# Patient Record
Sex: Male | Born: 1975 | Race: White | Hispanic: No | Marital: Married | Smoking: Never smoker
Health system: Southern US, Community
[De-identification: ages and names within clinical notes are randomized; demographics above are authoritative.]

---

## 2001-04-01 ENCOUNTER — Emergency Department (HOSPITAL_COMMUNITY): Admission: EM | Admit: 2001-04-01 | Discharge: 2001-04-01 | Payer: Self-pay | Admitting: Emergency Medicine

## 2003-12-06 ENCOUNTER — Encounter: Admission: RE | Admit: 2003-12-06 | Discharge: 2004-03-05 | Payer: Self-pay | Admitting: Family Medicine

## 2004-05-01 ENCOUNTER — Ambulatory Visit (HOSPITAL_COMMUNITY)
Admission: RE | Admit: 2004-05-01 | Discharge: 2004-05-01 | Payer: Self-pay | Admitting: Physical Medicine and Rehabilitation

## 2004-06-11 ENCOUNTER — Ambulatory Visit (HOSPITAL_COMMUNITY): Admission: RE | Admit: 2004-06-11 | Discharge: 2004-06-11 | Payer: Self-pay | Admitting: Orthopedic Surgery

## 2010-02-05 ENCOUNTER — Emergency Department (HOSPITAL_COMMUNITY): Admission: EM | Admit: 2010-02-05 | Discharge: 2010-02-05 | Payer: Self-pay | Admitting: Emergency Medicine

## 2013-10-23 IMAGING — CR DG CHEST 2V
2 series · 2 of 2 positions shown · non-contrast
Comparison: None.

CLINICAL DATA: History of pneumonia.

CHEST - 2 VIEW

[view not recorded (1 of 2)]
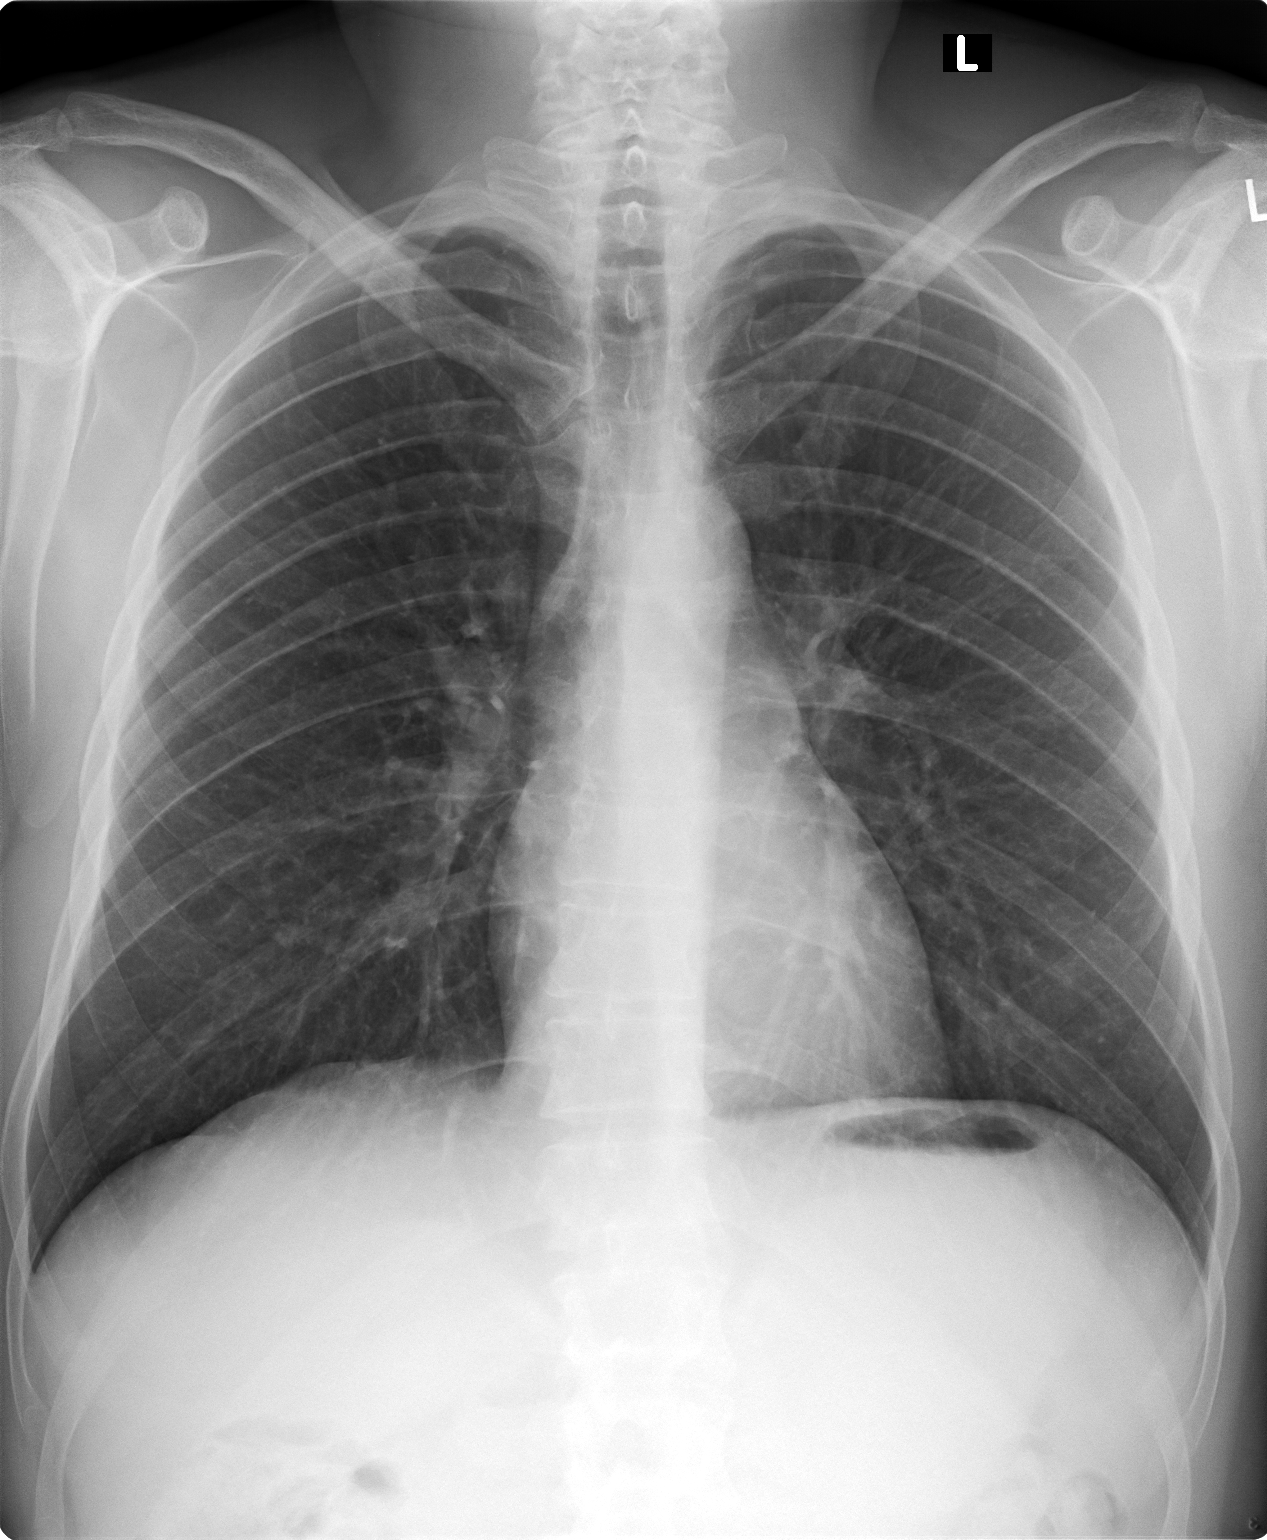

[view not recorded (2 of 2)]
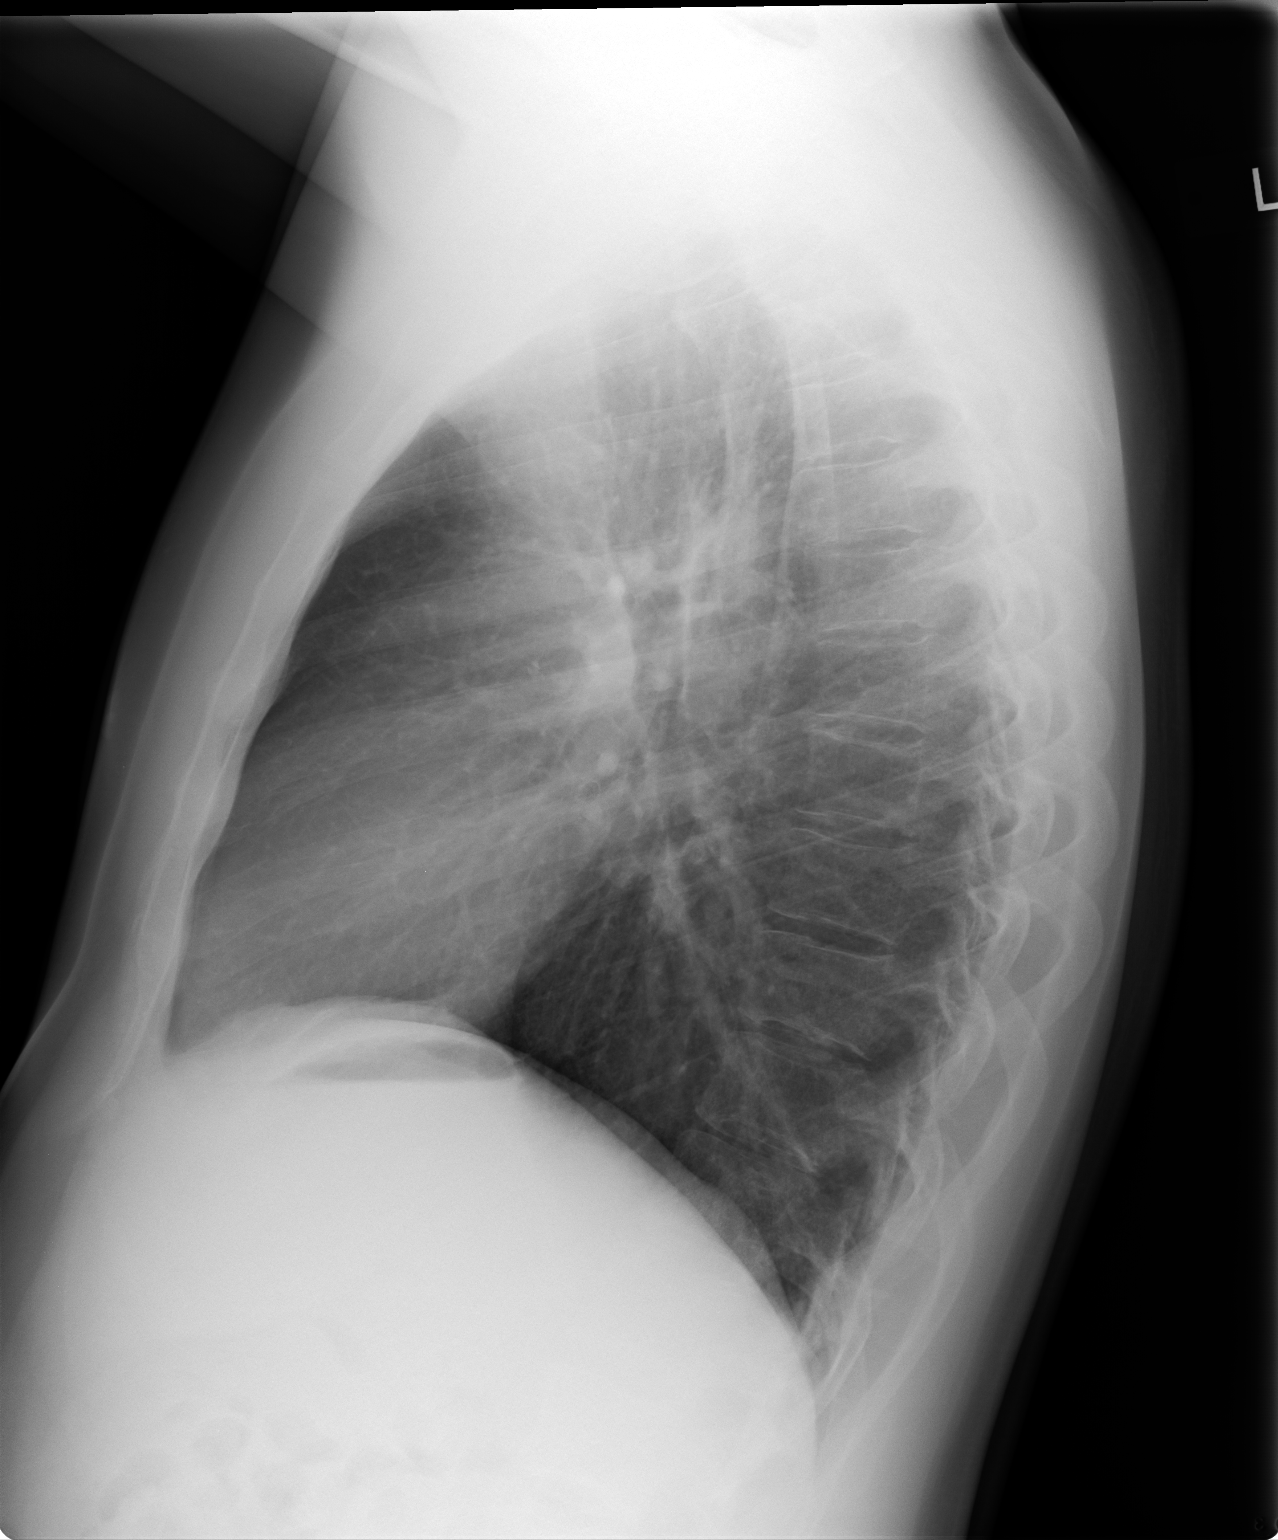

[2 of 2 positions shown; findings below may reference images not displayed]

FINDINGS: Lungs are clear.  Heart size is normal.  No pneumothorax
or pleural fluid.  No focal bony abnormality.
IMPRESSION: Negative chest.

## 2018-01-18 ENCOUNTER — Ambulatory Visit: Payer: Worker's Compensation | Attending: Specialist | Admitting: Physical Therapy

## 2018-01-18 ENCOUNTER — Encounter: Payer: Self-pay | Admitting: Physical Therapy

## 2018-01-18 ENCOUNTER — Other Ambulatory Visit: Payer: Self-pay

## 2018-01-18 DIAGNOSIS — M542 Cervicalgia: Secondary | ICD-10-CM | POA: Insufficient documentation

## 2018-01-18 DIAGNOSIS — M5412 Radiculopathy, cervical region: Secondary | ICD-10-CM | POA: Diagnosis not present

## 2018-01-18 NOTE — Therapy (Signed)
[facility] [address] [place], [place], [address] Phone: [phone]   Fax:  [phone]  Physical Therapy Evaluation  Patient Details  Name: Bryan Lynn MRN: [id number] Date of Birth: 01/11/1976 Referring Provider: Leandra KernJeffery Beane, MD   Encounter Date: 01/18/2018  PT End of Session - 01/18/18 1751    Visit Number  1    Number of Visits  12    Date for PT Re-Evaluation  03/08/18    Authorization Type  Worker's Compensation    Authorization - Visit Number  1    Authorization - Number of Visits  12    PT Start Time  1435    PT Stop Time  1515    PT Time Calculation (min)  40 min    Activity Tolerance  Patient limited by pain    Behavior During Therapy  [facility] for tasks assessed/performed       History reviewed. No pertinent past medical history.  History reviewed. No pertinent surgical history.  There were no vitals filed for this visit.   Subjective Assessment - 01/18/18 1747    Subjective  Patient arrives to physical therapy with reports of neck pain as well as numbness and radiating pain down L UE after a work injury on 11/06/17. Patient reported lifting a 54 lb roller from a tight area when he felt a "kink in the neck"; when he came to stand upright, he heard a pop which caused pain down the anterior aspect of the shoulder down to the left palm.  Patient reported he had steroid injections in the left shoulder as well as in the cervical spine with temporary relief. Patient reports pain can reach an 8/10 with side bending his cervical spine to the left, cervical flexion and certain shoulder movements. Patient's pain at best is 1/10 in the morning and his pain increases as the day progresses. Patient is currently on light duty at work and is restricted by the doctor from lifting greater than 20 lbs overhead. Patient has an appointment for an MRI on 01/24/18. Patient's goals are to decrease pain, improve strength, improve movement, have  less difficulties with work activities and return to [employer].     Limitations  Lifting    Diagnostic tests  X-Ray; MRI pending (01/24/18)    Patient Stated Goals  get back to normal without pain    Currently in Pain?  Yes    Pain Score  3     Pain Location  Neck    Pain Orientation  Left;Posterior    Pain Type  Acute pain    Pain Radiating Towards  L palm    Pain Onset  1 to 4 weeks ago    Pain Frequency  Intermittent    Aggravating Factors   side bending to left or touching tender areas of neck    Pain Relieving Factors  side bending to right    Effect of Pain on Daily Activities  sharp pains with certain L shoulder movements         [facility] PT Assessment - 01/18/18 0001      Assessment   Medical Diagnosis  Cervical radiculopathy    Referring Provider  Leandra KernJeffery Beane, MD    Onset Date/Surgical Date  11/06/17    Hand Dominance  Right    Next MD Visit  n/a    Prior Therapy  no      Precautions   Precaution Comments  no lifting greater than 20 lbs overhead  Balance Screen   Has the patient fallen in the past 6 months  No    Has the patient had a decrease in activity level because of a fear of falling?   No    Is the patient reluctant to leave their home because of a fear of falling?   No      Home Public house manager residence      Prior Function   Level of Independence  Independent      Sensation   Light Touch  Appears Intact      Posture/Postural Control   Posture/Postural Control  Postural limitations    Postural Limitations  Rounded Shoulders;Forward head;Increased thoracic kyphosis      ROM / Strength   AROM / PROM / Strength  AROM;Strength      AROM   AROM Assessment Site  Cervical    Cervical Flexion  19 (+) pain    Cervical Extension  26    Cervical - Right Side Bend  49    Cervical - Left Side Bend  50 (+) pain    Cervical - Right Rotation  65    Cervical - Left Rotation  55 (+) pain      Strength   Strength Assessment Site   Shoulder;Hand    Right/Left Shoulder  Right;Left    Right Shoulder Flexion  4/5    Right Shoulder ABduction  4/5    Right Shoulder Internal Rotation  5/5    Right Shoulder External Rotation  5/5    Left Shoulder Flexion  4-/5    Left Shoulder ABduction  4-/5    Left Shoulder Internal Rotation  5/5    Left Shoulder External Rotation  5/5    Right/Left hand  Right;Left    Right Hand Grip (lbs)  52    Left Hand Grip (lbs)  34      Palpation   Palpation comment  very tender to palpation on on L cervical paraspinals, increased pressure C6-T1 L lamina gutter increases symptoms down L LE, tenderness to palpation to left anterior shoulder      Special Tests    Special Tests  Cervical    Cervical Tests  Dictraction      Distraction Test   Comment  Increase of symptoms with supine distraction                Objective measurements completed on examination: See above findings.              PT Education - 01/18/18 1751    Education Details  scapular retractions, chin tucks    Methods  Handout;Demonstration;Explanation    Comprehension  Returned demonstration;Verbalized understanding       PT Short Term Goals - 01/18/18 1754      PT SHORT TERM GOAL #1   Title  Patient will be independent with HEP    Time  3    Period  Weeks    Status  New      PT SHORT TERM GOAL #2   Title  Patient will report neurological symptom centralization to left shoulder to indicate reduced nerve irritation.    Time  3    Period  Weeks    Status  New        PT Long Term Goals - 01/18/18 1755      PT LONG TERM GOAL #1   Title  Patient will report no neurological symptoms down L UE to  indicate no nerve irritation.    Time  6    Period  Weeks    Status  New      PT LONG TERM GOAL #2   Title  Patient will improve L grip strength to 45 lbs or equal to R grip strength to improve ability to perform work activities.    Time  6    Period  Weeks    Status  New      PT LONG TERM  GOAL #3   Title  Patient will improve L shoulder MMT in all planes to 5/5 to improve stability during work activities.    Time  6    Period  Weeks    Status  New      PT LONG TERM GOAL #4   Title  Patient will improve cervical AROM in all planes to WNL with less than 2/10 pain to improve ability to perform work activities and scan environment.     Time  6    Period  Weeks    Status  New             Plan - 01/18/18 1752    Clinical Impression Statement  Patient is a 42 year old male who presents to physical therapy with cervical and anterior shoulder pain, decreased cervical AROM and decreased left shoulder strength and left grip strength. Patient reports normal sensation; left bicep DTR is 1+/4. Patient tender to palpation along L cervical paraspinals and reports deep pressure around the left lamina gutter of C6-T1 causes increase symptoms down L LE to occur. Patient noted with increased pain while lying supine and reported discomfort with cervical PROM in all planes. Patient would benefit from skilled physical therapy to decrease pain and address patient's goals.     Clinical Presentation  Evolving    Clinical Presentation due to:  not improving    Clinical Decision Making  Low    Rehab Potential  Good    PT Frequency  2x / week    PT Duration  6 weeks    PT Treatment/Interventions  Iontophoresis 4mg /ml Dexamethasone;ADLs/Self Care Home Management;Traction;Moist Heat;Cryotherapy;Electrical Stimulation;Ultrasound;Therapeutic activities;Therapeutic exercise;Patient/family education;Neuromuscular re-education;Manual techniques;Passive range of motion    PT Next Visit Plan  UBE, chin tucks rows, extension, STW/M to L cervical spine, modalities PRN for pain relief.    PT Home Exercise Plan  see patient education section    Consulted and Agree with Plan of Care  Patient       Patient will benefit from skilled therapeutic intervention in order to improve the following deficits and  impairments:  Pain, Impaired UE functional use, Decreased range of motion, Decreased strength  Visit Diagnosis: Radiculopathy, cervical region - Plan: PT plan of care cert/re-cert  Cervicalgia - Plan: PT plan of care cert/re-cert     Problem List There are no active problems to display for this patient.  [name], PT, DPT 01/18/2018, 6:07 PM  [facility] [address] [place], [place], [address] Phone: [phone]   Fax:  [phone]  Name: Bryan Lynn MRN: [id number] Date of Birth: 1976-04-07

## 2018-01-21 ENCOUNTER — Ambulatory Visit: Payer: Worker's Compensation | Admitting: Physical Therapy

## 2018-01-21 ENCOUNTER — Encounter: Payer: Self-pay | Admitting: Physical Therapy

## 2018-01-21 DIAGNOSIS — M5412 Radiculopathy, cervical region: Secondary | ICD-10-CM

## 2018-01-21 DIAGNOSIS — M542 Cervicalgia: Secondary | ICD-10-CM

## 2018-01-21 NOTE — Therapy (Signed)
[facility] [address] [place], [place], [address] Phone: [phone]   Fax:  [phone]  Physical Therapy Treatment  Patient Details  Name: Bryan Lynn MRN: [id number] Date of Birth: 02/25/1976 Referring Provider: Leandra KernJeffery Beane, MD   Encounter Date: 01/21/2018  PT End of Session - 01/21/18 0818    Visit Number  2    Number of Visits  12    Date for PT Re-Evaluation  03/08/18    Authorization Type  Worker's Compensation    Authorization - Visit Number  2    Authorization - Number of Visits  12    PT Start Time  [phone]    PT Stop Time  0908    PT Time Calculation (min)  51 min    Activity Tolerance  Patient limited by pain    Behavior During Therapy  [facility] for tasks assessed/performed       History reviewed. No pertinent past medical history.  History reviewed. No pertinent surgical history.  There were no vitals filed for this visit.  Subjective Assessment - 01/21/18 0817    Subjective  Patient reports he has been doing his HEP and reports pain is 2/10 with some symptoms down to L palm    Limitations  Lifting    Diagnostic tests  X-Ray; MRI pending (01/24/18)    Patient Stated Goals  get back to normal without pain    Currently in Pain?  Yes    Pain Score  2     Pain Location  Neck    Pain Orientation  Left;Posterior    Pain Descriptors / Indicators  Discomfort    Pain Type  Acute pain    Pain Onset  1 to 4 weeks ago    Pain Frequency  Intermittent         [facility] PT Assessment - 01/21/18 0001      Assessment   Medical Diagnosis  Cervical radiculopathy    Onset Date/Surgical Date  11/06/17    Hand Dominance  Right    Next MD Visit  n/a    Prior Therapy  no      Precautions   Precaution Comments  no lifting greater than 20 lbs overhead                   [facility] Adult PT Treatment/Exercise - 01/21/18 0001      Exercises   Exercises  Neck      Neck Exercises: Machines for Strengthening   UBE  (Upper Arm Bike)  120 RPM, 8 minutes (4 fwd, 4 bwd)      Neck Exercises: Theraband   Shoulder Extension  20 reps Pink XTS    Rows  20 reps    Rows Limitations  Pink XTS      Neck Exercises: Standing   Neck Retraction  10 reps;20 reps;3 secs      Modalities   Modalities  Electrical Stimulation;Moist Heat      Moist Heat Therapy   Number Minutes Moist Heat  15 Minutes    Moist Heat Location  Cervical      Electrical Stimulation   Electrical Stimulation Location  cervical spine and L UT    Electrical Stimulation Action  IFC    Electrical Stimulation Parameters  80-150 hz x15 min    Electrical Stimulation Goals  Pain      Manual Therapy   Manual Therapy  Soft tissue mobilization    Soft tissue mobilization  STW/M to cervical  paraspinals and L UT to decrease pain and muscle tone               PT Short Term Goals - 01/18/18 1754      PT SHORT TERM GOAL #1   Title  Patient will be independent with HEP    Time  3    Period  Weeks    Status  New      PT SHORT TERM GOAL #2   Title  Patient will report neurological symptom centralization to left shoulder to indicate reduced nerve irritation.    Time  3    Period  Weeks    Status  New        PT Long Term Goals - 01/18/18 1755      PT LONG TERM GOAL #1   Title  Patient will report no neurological symptoms down L UE to indicate no nerve irritation.    Time  6    Period  Weeks    Status  New      PT LONG TERM GOAL #2   Title  Patient will improve L grip strength to 45 lbs or equal to R grip strength to improve ability to perform work activities.    Time  6    Period  Weeks    Status  New      PT LONG TERM GOAL #3   Title  Patient will improve L shoulder MMT in all planes to 5/5 to improve stability during work activities.    Time  6    Period  Weeks    Status  New      PT LONG TERM GOAL #4   Title  Patient will improve cervical AROM in all planes to WNL with less than 2/10 pain to improve ability to perform  work activities and scan environment.     Time  6    Period  Weeks    Status  New            Plan - 01/21/18 0934    Clinical Impression Statement  Patient was able to tolerate treatment well with minimal reports of pain. Patient noted with good form after initial instruction and demonstration. Distraction in stitting assessed to which reported no pain or increase of symptoms. Normal response to modalities upon end of session.    Clinical Presentation  Evolving    Clinical Decision Making  Low    Rehab Potential  Good    PT Frequency  2x / week    PT Duration  6 weeks    PT Treatment/Interventions  Iontophoresis 4mg /ml Dexamethasone;ADLs/Self Care Home Management;Traction;Moist Heat;Cryotherapy;Electrical Stimulation;Ultrasound;Therapeutic activities;Therapeutic exercise;Patient/family education;Neuromuscular re-education;Manual techniques;Passive range of motion    PT Next Visit Plan  cervical traction, UBE, chin tucks rows, extension, STW/M to L cervical spine, modalities PRN for pain relief.    Consulted and Agree with Plan of Care  Patient       Patient will benefit from skilled therapeutic intervention in order to improve the following deficits and impairments:  Pain, Impaired UE functional use, Decreased range of motion, Decreased strength  Visit Diagnosis: Radiculopathy, cervical region  Cervicalgia     Problem List There are no active problems to display for this patient.  [name], PT, DPT 01/21/2018, 11:07 AM  [facility] [address] [place], [place], [address] Phone: [phone]   Fax:  [phone]  Name: Bryan Lynn MRN: [id number] Date of Birth: [birth year]

## 2018-01-26 ENCOUNTER — Ambulatory Visit: Payer: Worker's Compensation | Admitting: Physical Therapy

## 2018-01-26 DIAGNOSIS — M5412 Radiculopathy, cervical region: Secondary | ICD-10-CM

## 2018-01-26 DIAGNOSIS — M542 Cervicalgia: Secondary | ICD-10-CM

## 2018-01-26 NOTE — Therapy (Signed)
[facility] [address] [place], [place], [address] Phone: [phone]   Fax:  [phone]  Physical Therapy Treatment  Patient Details  Name: Bryan Lynn MRN: [id number] Date of Birth: 14-Apr-1976 Referring Provider: Leandra Kern, MD   Encounter Date: 01/26/2018  PT End of Session - 01/26/18 0819    Visit Number  3    Number of Visits  12    Date for PT Re-Evaluation  03/08/18    Authorization Type  Worker's Compensation    Authorization - Visit Number  3    Authorization - Number of Visits  12    PT Start Time  0818    PT Stop Time  0904    PT Time Calculation (min)  46 min    Activity Tolerance  Patient tolerated treatment well    Behavior During Therapy  [facility] for tasks assessed/performed       No past medical history on file.  No past surgical history on file.  There were no vitals filed for this visit.  Subjective Assessment - 01/26/18 0857    Subjective  Patient reports 3/10 pain with symtoms in anterior shoulder as well as running down to L palm. Patient is to have MRI tomorrow; unsure of MD follow up.    Limitations  Lifting    Diagnostic tests  X-Ray; MRI pending (01/27/18)    Patient Stated Goals  get back to normal without pain    Currently in Pain?  Yes    Pain Score  3     Pain Location  Neck    Pain Orientation  Left;Posterior    Pain Descriptors / Indicators  Numbness;Tingling    Pain Type  Acute pain    Pain Onset  More than a month ago    Pain Frequency  Intermittent                       [facility] Adult PT Treatment/Exercise - 01/26/18 0001      Exercises   Exercises  Neck      Neck Exercises: Machines for Strengthening   UBE (Upper Arm Bike)  120 RPM, 8 minutes (4 fwd, 4 bwd)      Neck Exercises: Theraband   Shoulder Extension  20 reps;10 reps    Shoulder Extension Limitations  Pink XTS    Rows  20 reps;10 reps    Rows Limitations  Pink XTS      Neck Exercises: Standing   Other  Standing Exercises  standing X to V with chin tuck x30      Neck Exercises: Supine   Neck Retraction  20 reps      Modalities   Modalities  Electrical Stimulation;Moist Heat      Moist Heat Therapy   Number Minutes Moist Heat  15 Minutes    Moist Heat Location  Cervical      Electrical Stimulation   Electrical Stimulation Location  80-150 hz x 15 min    Electrical Stimulation Action  IFC    Electrical Stimulation Parameters  80-150 hz x15 min    Electrical Stimulation Goals  Pain      Manual Therapy   Manual Therapy  Soft tissue mobilization;Manual Traction    Soft tissue mobilization  STW/M to cervical paraspinals and L UT to decrease pain and muscle tone    Manual Traction  manual traction 10 x 15 seconds  PT Short Term Goals - 01/18/18 1754      PT SHORT TERM GOAL #1   Title  Patient will be independent with HEP    Time  3    Period  Weeks    Status  New      PT SHORT TERM GOAL #2   Title  Patient will report neurological symptom centralization to left shoulder to indicate reduced nerve irritation.    Time  3    Period  Weeks    Status  New        PT Long Term Goals - 01/18/18 1755      PT LONG TERM GOAL #1   Title  Patient will report no neurological symptoms down L UE to indicate no nerve irritation.    Time  6    Period  Weeks    Status  New      PT LONG TERM GOAL #2   Title  Patient will improve L grip strength to 45 lbs or equal to R grip strength to improve ability to perform work activities.    Time  6    Period  Weeks    Status  New      PT LONG TERM GOAL #3   Title  Patient will improve L shoulder MMT in all planes to 5/5 to improve stability during work activities.    Time  6    Period  Weeks    Status  New      PT LONG TERM GOAL #4   Title  Patient will improve cervical AROM in all planes to WNL with less than 2/10 pain to improve ability to perform work activities and scan environment.     Time  6    Period  Weeks     Status  New            Plan - 01/26/18 0859    Clinical Impression Statement  Patient was able to tolerate treatment well. Patient noted with reports of numbness and tingling down L UE with X to V exercise. Patient was able to continue with rest. Patient was able to tolerate manual treatmetnt well with improvements of symptoms. Attempt mechanical traction at 15 lbs next visit. Normal response to modalities at end of session.     Clinical Presentation  Evolving    Clinical Decision Making  Low    Rehab Potential  Good    PT Frequency  2x / week    PT Duration  6 weeks    PT Treatment/Interventions  Iontophoresis 4mg /ml Dexamethasone;ADLs/Self Care Home Management;Traction;Moist Heat;Cryotherapy;Electrical Stimulation;Ultrasound;Therapeutic activities;Therapeutic exercise;Patient/family education;Neuromuscular re-education;Manual techniques;Passive range of motion    PT Next Visit Plan  Attempt mechanical cervical traction, UBE, chin tucks rows, extension, STW/M to L cervical spine, modalities PRN for pain relief.    Consulted and Agree with Plan of Care  Patient       Patient will benefit from skilled therapeutic intervention in order to improve the following deficits and impairments:  Pain, Impaired UE functional use, Decreased range of motion, Decreased strength  Visit Diagnosis: Radiculopathy, cervical region  Cervicalgia     Problem List There are no active problems to display for this patient.  Guss BundeKrystle Demarques Pilz, PT, DPT 01/26/2018, 9:13 AM  [facility] [address] [place], [place], [address] Phone: [phone]   Fax:  [phone]  Name: Bryan Lynn MRN: [id number] Date of Birth: 1976-05-13

## 2018-01-31 ENCOUNTER — Ambulatory Visit: Payer: Worker's Compensation | Attending: Specialist | Admitting: Physical Therapy

## 2018-01-31 DIAGNOSIS — M5412 Radiculopathy, cervical region: Secondary | ICD-10-CM | POA: Insufficient documentation

## 2018-01-31 DIAGNOSIS — M542 Cervicalgia: Secondary | ICD-10-CM | POA: Diagnosis present

## 2018-01-31 NOTE — Therapy (Addendum)
[facility] [address] [place], [place], [address] Phone: [phone]   Fax:  [phone]  Physical Therapy Treatment  Patient Details  Name: Bryan Lynn MRN: [id number] Date of Birth: [birth year] Referring Provider: Leandra KernJeffery Beane, MD   Encounter Date: 01/31/2018  PT End of Session - 01/31/18 0856    Visit Number  4    Number of Visits  12    Date for PT Re-Evaluation  03/08/18    Authorization Type  Worker's Compensation    Authorization - Visit Number  4    Authorization - Number of Visits  12    PT Start Time  0817    PT Stop Time  0906    PT Time Calculation (min)  49 min    Activity Tolerance  Patient tolerated treatment well    Behavior During Therapy  [facility] for tasks assessed/performed       No past medical history on file.  No past surgical history on file.  There were no vitals filed for this visit.  Subjective Assessment - 01/31/18 0820    Subjective  Patient reports being at about 4.5/10. Reported feeling good and increased activity over the weekend.    Limitations  Lifting    Diagnostic tests  X-Ray; MRI pending (01/27/18)    Patient Stated Goals  get back to normal without pain    Currently in Pain?  Yes    Pain Score  4     Pain Location  Neck    Pain Orientation  Left;Posterior    Pain Descriptors / Indicators  Tingling;Numbness    Pain Type  Acute pain    Pain Onset  More than a month ago    Pain Frequency  Intermittent         [facility] PT Assessment - 01/31/18 0001      Assessment   Medical Diagnosis  Cervical radiculopathy    Onset Date/Surgical Date  11/06/17    Hand Dominance  Right    Next MD Visit  n/a    Prior Therapy  no      Precautions   Precaution Comments  no lifting greater than 20 lbs overhead                   [facility] Adult PT Treatment/Exercise - 01/31/18 0001      Exercises   Exercises  Neck      Neck Exercises: Machines for Strengthening   UBE (Upper Arm Bike)   120 RPM, 8 minutes (4 fwd, 4 bwd)      Neck Exercises: Theraband   Shoulder Extension  20 reps;10 reps    Shoulder Extension Limitations  Pink XTS    Rows  20 reps;10 reps    Rows Limitations  Pink XTS      Modalities   Modalities  Electrical Stimulation;Moist Heat;Ultrasound      Moist Heat Therapy   Number Minutes Moist Heat  15 Minutes    Moist Heat Location  Cervical      Electrical Stimulation   Electrical Stimulation Location  80-150 hz x 15 min    Electrical Stimulation Action  IFC    Electrical Stimulation Parameters  80-150 hz x15 min    Electrical Stimulation Goals  Pain      Ultrasound   Ultrasound Location  L cervical paraspinals and L UT    Ultrasound Parameters  Combo E-stim/US 100%, 1mhz , 1.5 w/cm2 x12 mins    Ultrasound Goals  Pain  PT Short Term Goals - 01/18/18 1754      PT SHORT TERM GOAL #1   Title  Patient will be independent with HEP    Time  3    Period  Weeks    Status  New      PT SHORT TERM GOAL #2   Title  Patient will report neurological symptom centralization to left shoulder to indicate reduced nerve irritation.    Time  3    Period  Weeks    Status  New        PT Long Term Goals - 01/18/18 1755      PT LONG TERM GOAL #1   Title  Patient will report no neurological symptoms down L UE to indicate no nerve irritation.    Time  6    Period  Weeks    Status  New      PT LONG TERM GOAL #2   Title  Patient will improve L grip strength to 45 lbs or equal to R grip strength to improve ability to perform work activities.    Time  6    Period  Weeks    Status  New      PT LONG TERM GOAL #3   Title  Patient will improve L shoulder MMT in all planes to 5/5 to improve stability during work activities.    Time  6    Period  Weeks    Status  New      PT LONG TERM GOAL #4   Title  Patient will improve cervical AROM in all planes to WNL with less than 2/10 pain to improve ability to perform work activities and scan  environment.     Time  6    Period  Weeks    Status  New            Plan - 01/31/18 0857    Clinical Impression Statement  Patient was able to tolerate well despite increased pain at start of session. Patient noted with good response to US/E-stim combo. Patient educated traction will be placed on hold until improvements in pain. Patient reported agreeement.    Clinical Presentation  Evolving    Clinical Decision Making  Low    Rehab Potential  Good    PT Frequency  2x / week    PT Duration  6 weeks    PT Treatment/Interventions  Iontophoresis 4mg /ml Dexamethasone;ADLs/Self Care Home Management;Traction;Moist Heat;Cryotherapy;Electrical Stimulation;Ultrasound;Therapeutic activities;Therapeutic exercise;Patient/family education;Neuromuscular re-education;Manual techniques;Passive range of motion    PT Next Visit Plan  Attempt mechanical cervical traction, UBE, chin tucks rows, extension, STW/M to L cervical spine, modalities PRN for pain relief.    Consulted and Agree with Plan of Care  Patient       Patient will benefit from skilled therapeutic intervention in order to improve the following deficits and impairments:  Pain, Impaired UE functional use, Decreased range of motion, Decreased strength  Visit Diagnosis: Radiculopathy, cervical region  Cervicalgia     Problem List There are no active problems to display for this patient.  [name], PT, DPT 01/31/2018, 9:16 AM  [facility] [address] [place], [place], [address] Phone: [phone]   Fax:  [phone]  Name: Bryan Lynn MRN: [id number] Date of Birth: 02-02-1976

## 2018-02-04 ENCOUNTER — Ambulatory Visit: Payer: Worker's Compensation | Admitting: Physical Therapy

## 2018-02-04 DIAGNOSIS — M5412 Radiculopathy, cervical region: Secondary | ICD-10-CM | POA: Diagnosis not present

## 2018-02-04 DIAGNOSIS — M542 Cervicalgia: Secondary | ICD-10-CM

## 2018-02-04 NOTE — Therapy (Signed)
[facility] [address] [place], [place], [address] Phone: [phone]   Fax:  [phone]  Physical Therapy Treatment  Patient Details  Name: Bryan Lynn SessionMichael Lynn MRN: [id number] Date of Birth: Oct 13, 1975 Referring Provider: Leandra KernJeffery Beane, MD   Encounter Date: 02/04/2018  PT End of Session - 02/04/18 0856    Visit Number  5    Number of Visits  12    Date for PT Re-Evaluation  03/08/18    Authorization Type  Worker's Compensation    Authorization - Visit Number  5    Authorization - Number of Visits  12    PT Start Time  [phone]    PT Stop Time  0903    PT Time Calculation (min)  47 min    Activity Tolerance  Patient tolerated treatment well    Behavior During Therapy  [facility] for tasks assessed/performed       No past medical history on file.  No past surgical history on file.  There were no vitals filed for this visit.  Subjective Assessment - 02/04/18 0819    Subjective  Reports 2.5/10 and still has neurological symptoms down L UE.    Limitations  Lifting    Diagnostic tests  X-Ray; MRI pending (01/27/18)    Patient Stated Goals  get back to normal without pain    Currently in Pain?  Yes    Pain Score  2     Pain Location  Neck    Pain Orientation  Left;Posterior    Pain Descriptors / Indicators  Tightness;Numbness    Pain Type  Acute pain    Pain Onset  More than a month ago         [facility] - 02/04/18 0001      Assessment   Medical Diagnosis  Cervical radiculopathy    Onset Date/Surgical Date  11/06/17    Hand Dominance  Right    Next MD Visit  August 20    Prior Therapy  no      Precautions   Precaution Comments  no lifting greater than 20 lbs overhead                   [facility] Adult PT Treatment/Exercise - 02/04/18 0001      Exercises   Exercises  Neck      Neck Exercises: Machines for Strengthening   UBE (Upper Arm Bike)  90 RPM; 8 mins (4 fwd, 4 bwd)      Neck Exercises: Theraband   Shoulder Extension  20 reps;10 reps    Shoulder Extension Limitations  Pink XTS    Rows  20 reps;10 reps    Rows Limitations  Pink XTS      Neck Exercises: Seated   W Back Limitations  attempted, x10      Neck Exercises: Supine   Capital Flexion  10 reps;3 secs      Modalities   Modalities  Electrical Stimulation;Moist Heat;Ultrasound      Moist Heat Therapy   Number Minutes Moist Heat  15 Minutes    Moist Heat Location  Cervical      Electrical Stimulation   Electrical Stimulation Location  80-150 hz x15 mins    Electrical Stimulation Action  IFC    Electrical Stimulation Parameters  80-150 hz x15    Electrical Stimulation Goals  Pain      Neck Exercises: Stretches   Lower Cervical/Upper Thoracic Stretch  3 reps;20 seconds  PT Short Term Goals - 02/04/18 0901      PT SHORT TERM GOAL #1   Title  Patient will be independent with HEP    Time  3    Period  Weeks    Status  On-going      PT SHORT TERM GOAL #2   Title  Patient will report neurological symptom centralization to left shoulder to indicate reduced nerve irritation.    Time  3    Period  Weeks    Status  On-going        PT Long Term Goals - 02/04/18 0902      PT LONG TERM GOAL #1   Title  Patient will report no neurological symptoms down L UE to indicate no nerve irritation.    Time  6    Period  Weeks    Status  On-going      PT LONG TERM GOAL #2   Title  Patient will improve L grip strength to 45 lbs or equal to R grip strength to improve ability to perform work activities.    Time  6    Period  Weeks    Status  On-going   NT 02/04/18     PT LONG TERM GOAL #3   Title  Patient will improve L shoulder MMT in all planes to 5/5 to improve stability during work activities.    Time  6    Period  Weeks    Status  On-going      PT LONG TERM GOAL #4   Title  Patient will improve cervical AROM in all planes to WNL with less than 2/10 pain to improve ability to perform work  activities and scan environment.     Time  6    Period  Weeks    Status  On-going            Plan - 02/04/18 0919    Clinical Impression Statement  Patient was able to tolerate treatment well despite increase of pain during W exercise. Exercise attempted for 10 reps then terminated. Patient inquired exercises for cervical arthritis and spinal stenosis. Patient provided with cervical neck stretches educated on anatomy and treatment for diagnoses. Patient reported understanding. Normal response to modalities upon removal.     Clinical Presentation  Evolving    Clinical Decision Making  Low    Rehab Potential  Good    PT Frequency  2x / week    PT Duration  6 weeks    PT Treatment/Interventions  Iontophoresis 4mg /ml Dexamethasone;ADLs/Self Care Home Management;Traction;Moist Heat;Cryotherapy;Electrical Stimulation;Ultrasound;Therapeutic activities;Therapeutic exercise;Patient/family education;Neuromuscular re-education;Manual techniques;Passive range of motion    PT Next Visit Plan  Attempt mechanical cervical traction, UBE, chin tucks rows, extension, STW/M to L cervical spine, modalities PRN for pain relief.    Consulted and Agree with Plan of Care  Patient       Patient will benefit from skilled therapeutic intervention in order to improve the following deficits and impairments:  Pain, Impaired UE functional use, Decreased range of motion, Decreased strength  Visit Diagnosis: Radiculopathy, cervical region  Cervicalgia     Problem List There are no active problems to display for this patient.  Bryan Lynn, PT, DPT 02/04/2018, 9:22 AM  [facility] [address] [place], [place], [address] Phone: [phone]   Fax:  [phone]  Name: Bryan Lynn MRN: [id number] Date of Birth: Nov 13, 1975

## 2018-02-09 ENCOUNTER — Ambulatory Visit: Payer: Worker's Compensation | Admitting: Physical Therapy

## 2018-02-09 ENCOUNTER — Encounter: Payer: Self-pay | Admitting: Physical Therapy

## 2018-02-09 DIAGNOSIS — M5412 Radiculopathy, cervical region: Secondary | ICD-10-CM

## 2018-02-09 DIAGNOSIS — M542 Cervicalgia: Secondary | ICD-10-CM

## 2018-02-09 NOTE — Therapy (Signed)
[facility] [address] [place], [place], [address] Phone: [phone]   Fax:  [phone]  Physical Therapy Treatment  Patient Details  Name: Bryan Lynn SessionMichael Spradley MRN: [id number] Date of Birth: [birth year] Referring Provider: Leandra KernJeffery Beane, MD   Encounter Date: 02/09/2018  PT End of Session - 02/09/18 0932    Visit Number  6    Number of Visits  12    Date for PT Re-Evaluation  03/08/18    Authorization Type  Worker's Compensation    PT Start Time  0901    PT Stop Time  0946    PT Time Calculation (min)  45 min    Activity Tolerance  Patient tolerated treatment well    Behavior During Therapy  [facility] for tasks assessed/performed       History reviewed. No pertinent past medical history.  History reviewed. No pertinent surgical history.  There were no vitals filed for this visit.  Subjective Assessment - 02/09/18 0904    Subjective  no new complaints, some overall improvement    Limitations  Lifting    Diagnostic tests  X-Ray; MRI pending (01/27/18)    Patient Stated Goals  get back to normal without pain    Currently in Pain?  Yes    Pain Score  2     Pain Location  Neck    Pain Orientation  Left    Pain Descriptors / Indicators  Discomfort;Aching    Pain Type  Acute pain    Pain Radiating Towards  forearm    Pain Onset  More than a month ago    Pain Frequency  Intermittent    Aggravating Factors   certain movements    Pain Relieving Factors  at rest                       [facility] - 02/09/18 0001      Exercises   Exercises  Neck      Neck Exercises: Machines for Strengthening   UBE (Upper Arm Bike)  90 RPM; 8 mins (4 fwd, 4 bwd)      Neck Exercises: Theraband   Shoulder Extension  20 reps;10 reps    Shoulder Extension Limitations  Pink XTS    Rows  20 reps;10 reps    Rows Limitations  Pink XTS      Neck Exercises: Standing   Neck Retraction  Other reps (comment)    Neck  Retraction Limitations  2x10 with 2# flexion and scaption /chin tucks    Wall Push Ups  20 reps      Neck Exercises: Seated   W Back  20 reps;Limitations    W Back Limitations  with scap retractions using ball for restisance      Neck Exercises: Supine   Cervical Isometrics  Flexion;Extension;Right lateral flexion;Left lateral flexion;5 secs;5 reps      Moist Heat Therapy   Number Minutes Moist Heat  15 Minutes    Moist Heat Location  Cervical      Electrical Stimulation   Electrical Stimulation Location  80-150 hz x15 mins    Electrical Stimulation Action  IFC    Electrical Stimulation Parameters  80-150hz  x2715min    Electrical Stimulation Goals  Pain               PT Short Term Goals - 02/04/18 0901      PT SHORT TERM GOAL #1   Title  Patient will be  independent with HEP    Time  3    Period  Weeks    Status  On-going      PT SHORT TERM GOAL #2   Title  Patient will report neurological symptom centralization to left shoulder to indicate reduced nerve irritation.    Time  3    Period  Weeks    Status  On-going        PT Long Term Goals - 02/09/18 [id number]      PT LONG TERM GOAL #1   Title  Patient will report no neurological symptoms down L UE to indicate no nerve irritation.    Time  6    Period  Weeks    Status  On-going   down to forearm 02/09/18     PT LONG TERM GOAL #2   Title  Patient will improve L grip strength to 45 lbs or equal to R grip strength to improve ability to perform work activities.    Time  6    Period  Weeks    Status  --   35# 02/09/18     PT LONG TERM GOAL #3   Title  Patient will improve L shoulder MMT in all planes to 5/5 to improve stability during work activities.    Time  6    Period  Weeks    Status  On-going      PT LONG TERM GOAL #4   Title  Patient will improve cervical AROM in all planes to WNL with less than 2/10 pain to improve ability to perform work activities and scan environment.     Time  6    Period  Weeks     Status  On-going            Plan - 02/09/18 [id number]    Clinical Impression Statement  Patient tolerated treatment well today. Patient able to progress with cervical stabilization exercises with good form and no discomfort. Patient has some ongoing ache that radiates down left UE to forearm. Patient progressing toward goals yet limited with pain.     Rehab Potential  Good    PT Frequency  2x / week    PT Duration  6 weeks    PT Treatment/Interventions  Iontophoresis 4mg /ml Dexamethasone;ADLs/Self Care Home Management;Traction;Moist Heat;Cryotherapy;Electrical Stimulation;Ultrasound;Therapeutic activities;Therapeutic exercise;Patient/family education;Neuromuscular re-education;Manual techniques;Passive range of motion    PT Next Visit Plan  Attempt mechanical cervical traction and RW4. STW/M to L cervical spine, modalities PRN for pain relief.    Consulted and Agree with Plan of Care  Patient       Patient will benefit from skilled therapeutic intervention in order to improve the following deficits and impairments:  Pain, Impaired UE functional use, Decreased range of motion, Decreased strength  Visit Diagnosis: Radiculopathy, cervical region  Cervicalgia     Problem List There are no active problems to display for this patient.   [name], CHRISTINA Lynn, PTA 02/09/2018, 9:54 AM  [facility] [address] [place], [place], [address] Phone: [phone]   Fax:  [phone]  Name: Bryan Lynn SessionMichael Muston MRN: [id number] Date of Birth: [birth year]

## 2018-02-14 ENCOUNTER — Encounter: Payer: Self-pay | Admitting: Physical Therapy

## 2018-02-14 ENCOUNTER — Ambulatory Visit: Payer: Worker's Compensation | Admitting: Physical Therapy

## 2018-02-14 DIAGNOSIS — M542 Cervicalgia: Secondary | ICD-10-CM

## 2018-02-14 DIAGNOSIS — M5412 Radiculopathy, cervical region: Secondary | ICD-10-CM | POA: Diagnosis not present

## 2018-02-14 NOTE — Therapy (Addendum)
[facility] [place], [place], [address] Phone: [phone]   Fax:  [phone]  Physical Therapy Treatment/Discharge  PHYSICAL THERAPY DISCHARGE SUMMARY  Visits from Start of Care: 7  Current functional level related to goals / functional outcomes: See below   Remaining deficits: See goals   Education / Equipment: HEP Plan: Patient agrees to discharge.  Patient goals were not met. Patient is being discharged due to not returning since the last visit.  ?????   Gabriela Eves, PT, DPT    Patient Details  Name: Bryan Lynn MRN: [id number] Date of Birth: [birth year] Referring Provider: Donnald Garre, MD   Encounter Date: 02/14/2018  PT End of Session - 02/14/18 0748    Visit Number  7    Number of Visits  12    Date for PT Re-Evaluation  03/08/18    Authorization Type  Worker's Compensation    Authorization - Visit Number  7    Authorization - Number of Visits  12    PT Start Time  0730    PT Stop Time  0820    PT Time Calculation (min)  50 min    Activity Tolerance  Patient tolerated treatment well    Behavior During Therapy  [facility] for tasks assessed/performed       History reviewed. No pertinent past medical history.  History reviewed. No pertinent surgical history.  There were no vitals filed for this visit.  Subjective Assessment - 02/14/18 0739    Subjective  Patient reported "doing a little too much over the weekend."    Limitations  Lifting    Diagnostic tests  X-Ray; MRI pending (01/27/18)    Patient Stated Goals  get back to normal without pain    Currently in Pain?  Yes    Pain Score  4     Pain Location  Neck    Pain Orientation  Left    Pain Descriptors / Indicators  Discomfort;Aching;Tingling    Pain Type  Acute pain    Pain Onset  More than a month ago    Pain Frequency  Intermittent         [facility] PT Assessment - 02/14/18 0001      Assessment   Medical Diagnosis  Cervical  radiculopathy    Onset Date/Surgical Date  11/06/17    Hand Dominance  Right    Next MD Visit  August 20    Prior Therapy  no      AROM   Cervical Flexion  34   (+)tightness   Cervical Extension  40    Cervical - Right Side Bend  30    Cervical - Left Side Bend  32    Cervical - Right Rotation  65    Cervical - Left Rotation  55   (+) tight/pain     Strength   Right Shoulder Flexion  4/5    Right Shoulder ABduction  4+/5    Right Shoulder Internal Rotation  5/5    Right Shoulder External Rotation  5/5    Left Shoulder Flexion  4/5    Left Shoulder ABduction  4/5    Left Shoulder Internal Rotation  5/5    Left Shoulder External Rotation  5/5    Right Hand Grip (lbs)  110 lbs/ 48 kg    Left Hand Grip (lbs)  65 lbs/ 30 kg  [facility] Adult PT Treatment/Exercise - 02/14/18 0001      Exercises   Exercises  Neck      Neck Exercises: Machines for Strengthening   UBE (Upper Arm Bike)  90 RPM; 8 mins (4 fwd, 4 bwd)      Neck Exercises: Theraband   Shoulder Extension  20 reps;10 reps    Shoulder Extension Limitations  Pink XTS    Rows  20 reps;10 reps    Rows Limitations  Pink XTS      Neck Exercises: Standing   Upper Extremity D2  Flexion;20 reps      Neck Exercises: Supine   Cervical Isometrics  Flexion;Extension;Right lateral flexion;Left lateral flexion;5 secs;10 reps    Capital Flexion  10 reps;3 secs      Modalities   Modalities  Electrical Stimulation;Moist Heat      Moist Heat Therapy   Number Minutes Moist Heat  15 Minutes    Moist Heat Location  Cervical      Electrical Stimulation   Electrical Stimulation Location  80-150 hz x15 mins    Electrical Stimulation Action  IFC    Electrical Stimulation Parameters  80-150 x15 min    Electrical Stimulation Goals  Pain      Neck Exercises: Stretches   Upper Trapezius Stretch  Right;3 reps;30 seconds    Levator Stretch  Right;3 reps;30 seconds               PT Short Term  Goals - 02/04/18 0901      PT SHORT TERM GOAL #1   Title  Patient will be independent with HEP    Time  3    Period  Weeks    Status  On-going      PT SHORT TERM GOAL #2   Title  Patient will report neurological symptom centralization to left shoulder to indicate reduced nerve irritation.    Time  3    Period  Weeks    Status  On-going        PT Long Term Goals - 02/09/18 2947      PT LONG TERM GOAL #1   Title  Patient will report no neurological symptoms down L UE to indicate no nerve irritation.    Time  6    Period  Weeks    Status  On-going   down to forearm 02/09/18     PT LONG TERM GOAL #2   Title  Patient will improve L grip strength to 45 lbs or equal to R grip strength to improve ability to perform work activities.    Time  6    Period  Weeks    Status  --   35# 02/09/18     PT LONG TERM GOAL #3   Title  Patient will improve L shoulder MMT in all planes to 5/5 to improve stability during work activities.    Time  6    Period  Weeks    Status  On-going      PT LONG TERM GOAL #4   Title  Patient will improve cervical AROM in all planes to WNL with less than 2/10 pain to improve ability to perform work activities and scan environment.     Time  6    Period  Weeks    Status  On-going            Plan - 02/14/18 0809    Clinical Impression Statement  Patient was able to completete despite reports  of increase of pain at start of session. Patient overall believes therapy has been helping but progress is slow and still experiences neurological symptoms down L UE intermittently. Patient has been compliant with HEP. Patient's goals are ongoing at this time. Normal response to modalities at end of session.     Clinical Presentation  Evolving    Clinical Decision Making  Low    Rehab Potential  Good    PT Frequency  2x / week    PT Duration  6 weeks    PT Treatment/Interventions  Iontophoresis '4mg'$ /ml Dexamethasone;ADLs/Self Care Home Management;Traction;Moist  Heat;Cryotherapy;Electrical Stimulation;Ultrasound;Therapeutic activities;Therapeutic exercise;Patient/family education;Neuromuscular re-education;Manual techniques;Passive range of motion    PT Next Visit Plan  Add RW4. STW/M to L cervical spine, modalities PRN for pain relief.    Consulted and Agree with Plan of Care  Patient       Patient will benefit from skilled therapeutic intervention in order to improve the following deficits and impairments:  Pain, Impaired UE functional use, Decreased range of motion, Decreased strength  Visit Diagnosis: Radiculopathy, cervical region  Cervicalgia     Problem List There are no active problems to display for this patient.  Gabriela Eves, PT, DPT 02/14/2018, 8:38 AM  [facility] [address] [place], [place], [address] Phone: [phone]   Fax:  [phone]  Name: Bren Steers MRN: [id number] Date of Birth: May 20, 1976

## 2018-02-18 ENCOUNTER — Encounter: Payer: Self-pay | Admitting: Physical Therapy

## 2018-08-13 ENCOUNTER — Emergency Department (HOSPITAL_COMMUNITY): Payer: Managed Care, Other (non HMO)

## 2018-08-13 ENCOUNTER — Encounter (HOSPITAL_COMMUNITY): Payer: Self-pay

## 2018-08-13 ENCOUNTER — Emergency Department (HOSPITAL_COMMUNITY)
Admission: EM | Admit: 2018-08-13 | Discharge: 2018-08-13 | Disposition: A | Discharge status: Home / Self Care | Payer: Managed Care, Other (non HMO) | Attending: Emergency Medicine | Admitting: Emergency Medicine

## 2018-08-13 ENCOUNTER — Other Ambulatory Visit: Payer: Self-pay

## 2018-08-13 DIAGNOSIS — N50811 Right testicular pain: Secondary | ICD-10-CM | POA: Insufficient documentation

## 2018-08-13 LAB — URINALYSIS, ROUTINE W REFLEX MICROSCOPIC
Bilirubin Urine: NEGATIVE
Glucose, UA: NEGATIVE mg/dL
Hgb urine dipstick: NEGATIVE
Ketones, ur: NEGATIVE mg/dL
Leukocytes,Ua: NEGATIVE
Nitrite: NEGATIVE
Protein, ur: NEGATIVE mg/dL
Specific Gravity, Urine: 1.005 (ref 1.005–1.030)
pH: 5 (ref 5.0–8.0)

## 2018-08-13 NOTE — ED Triage Notes (Signed)
Pt c/o pain in r testicle and r groin/pelvic area x 2 weeks.  Denies injury, strenuous activity, denies any bleeding or discharge.

## 2018-08-13 NOTE — ED Provider Notes (Signed)
[facility] EMERGENCY DEPARTMENT Provider Note   CSN: [id number] Arrival date & time: 08/13/18  0754     History   Chief Complaint Chief Complaint  Patient presents with  . Testicle Pain    HPI Bryan Lynn is a 43 y.o. male.  HPI   Bryan Lynn is a 43 y.o. male who presents to the Emergency Department complaining of pain to his right testicle and groin for 2 weeks.  Pain worsening for 2 days.  He describes the pain as a "pulling" sensation from his testicle that radiates up to his suprapubic region.  He states at times he feels his testicle "drawing up."  He denies known injury, swelling, redness, dysuria or hesitancy, rash and abdominal pain.  No previous abdominal surgeries, penile discharge, fever, chills or vomiting.     History reviewed. No pertinent past medical history.  There are no active problems to display for this patient.   History reviewed. No pertinent surgical history.    Home Medications    Prior to Admission medications   Not on File    Family History No family history on file.  Social History Social History   Tobacco Use  . Smoking status: Never Smoker  . Smokeless tobacco: Never Used  Substance Use Topics  . Alcohol use: Yes    Comment: occ  . Drug use: Never     Allergies   Patient has no known allergies.   Review of Systems Review of Systems  Constitutional: Negative for appetite change, chills and fever.  Respiratory: Negative for shortness of breath.   Cardiovascular: Negative for chest pain.  Gastrointestinal: Positive for abdominal pain, nausea and vomiting. Negative for blood in stool and diarrhea.  Genitourinary: Positive for testicular pain. Negative for decreased urine volume, difficulty urinating, discharge, dysuria, flank pain, genital sores, penile pain, penile swelling and scrotal swelling.  Musculoskeletal: Negative for back pain.  Skin: Negative for color change and rash.  Neurological: Negative for  dizziness, weakness and numbness.  Hematological: Negative for adenopathy.     Physical Exam Updated Vital Signs BP 137/89 (BP Location: Right Arm)   Pulse 77   Temp 98.4 F (36.9 C) (Oral)   Resp 18   Ht 5\' 8"  (1.727 m)   Wt 79.4 kg   SpO2 99%   BMI 26.61 kg/m   Physical Exam Vitals signs and nursing note reviewed.  Constitutional:      General: He is not in acute distress.    Appearance: Normal appearance. He is not ill-appearing.     Comments: Pt's spouse is present during exam  HENT:     Mouth/Throat:     Mouth: Mucous membranes are moist.     Pharynx: Oropharynx is clear.  Neck:     Musculoskeletal: Normal range of motion.  Cardiovascular:     Rate and Rhythm: Normal rate and regular rhythm.     Pulses: Normal pulses.  Pulmonary:     Effort: Pulmonary effort is normal.     Breath sounds: Normal breath sounds.  Abdominal:     General: There is no distension.     Palpations: Abdomen is soft. There is no mass.     Tenderness: There is no abdominal tenderness. There is no guarding.     Hernia: There is no hernia in the right inguinal area.  Genitourinary:    Pubic Area: No rash.      Penis: Circumcised. No tenderness, discharge or swelling.      Scrotum/Testes: Cremasteric reflex  is present.        Right: Tenderness present. Mass or swelling not present.        Left: Mass, tenderness or swelling not present.     Epididymis:     Right: No tenderness.     Left: Normal.  Musculoskeletal: Normal range of motion.  Skin:    General: Skin is warm.     Capillary Refill: Capillary refill takes less than 2 seconds.     Findings: No rash.  Neurological:     General: No focal deficit present.     Mental Status: He is alert.     Sensory: No sensory deficit.     Motor: No weakness.      ED Treatments / Results  Labs (all labs ordered are listed, but only abnormal results are displayed) Labs Reviewed  URINALYSIS, ROUTINE W REFLEX MICROSCOPIC - Abnormal; Notable  for the following components:      Result Value   Color, Urine STRAW (*)    All other components within normal limits  GC/CHLAMYDIA PROBE AMP ([place]) NOT AT [facility]    EKG None  Radiology US Scrotum W/doppler  Result Date: 08/13/2018 CLINICAL DATA:  Right testis and groin pain worsening over the last 2 weeks. EXAM: SCROTAL ULTRASOUND DOPPLER ULTRASOUND OF THE TESTICLES TECHNIQUE: Complete ultrasound examination of the testicles, epididymis, and other scrotal structures was performed. Color and spectral Doppler ultrasound were also utilized to evaluate blood flow to the testicles. COMPARISON:  None. FINDINGS: Right testicle Measurements: 3.8 x 1.5 x 2.5 cm. No mass or microlithiasis visualized. Normal blood flow with color Doppler. Left testicle Measurements: 2.6 x 1.6 x 2.4 cm. No mass or microlithiasis visualized.Normal blood flow with color Doppler. Right epididymis:  Normal in size and appearance. Left epididymis:  Normal in size and appearance. Hydrocele:  None visualized. Varicocele:  None visualized. Pulsed Doppler interrogation of both testes demonstrates normal low resistance arterial and venous waveforms bilaterally. IMPRESSION: Normal scrotal ultrasound.  No evidence of testicular torsion. Electronically Signed   By: Carey Bullocks M.D.   On: 08/13/2018 10:54    Procedures Procedures (including critical care time)  Medications Ordered in ED Medications - No data to display   Initial Impression / Assessment and Plan / ED Course  I have reviewed the triage vital signs and the nursing notes.  Pertinent labs & imaging results that were available during my care of the patient were reviewed by me and considered in my medical decision making (see chart for details).     0830  Pt with right sided testicular pain.  No palpable masses.  Will obtain U/A, check for sti's and scrotal US.     [place] and exam reassuring.  Pt appropriate for d/c home, agrees to scrotal support, NSAID if  needed for pain and urology f/u.  Return precautions discussed.     Final Clinical Impressions(s) / ED Diagnoses   Final diagnoses:  Pain in right testicle    ED Discharge Orders    None       Pauline Aus, PA-C 08/13/18 1121    Mancel Bale, MD 08/13/18 1553

## 2018-08-13 NOTE — Discharge Instructions (Addendum)
Try wearing underwear that will provide support.  You may take ibuprofen every 6 hrs if needed for pain.  Follow-up with your doctor for recheck, or contact the urologist, Dr. Annabell Howells, at the number listed.  Return to ER for any worsening symptoms

## 2018-08-15 LAB — GC/CHLAMYDIA PROBE AMP (~~LOC~~) NOT AT ARMC
Chlamydia: NEGATIVE
Neisseria Gonorrhea: NEGATIVE

## 2020-06-14 IMAGING — US US SCROTUM W/ DOPPLER COMPLETE
1 series · 14 of 25 positions shown · non-contrast
Comparison: None.

CLINICAL DATA: Right testis and groin pain worsening over the last
2 weeks.

EXAM:
SCROTAL ULTRASOUND
DOPPLER ULTRASOUND OF THE TESTICLES
TECHNIQUE: Complete ultrasound examination of the testicles, epididymis, and
other scrotal structures was performed. Color and spectral Doppler
ultrasound were also utilized to evaluate blood flow to the
testicles.

[Series 1: us scrotum w/ doppler complete · 14 of 81 slices shown]
[im 1/81]
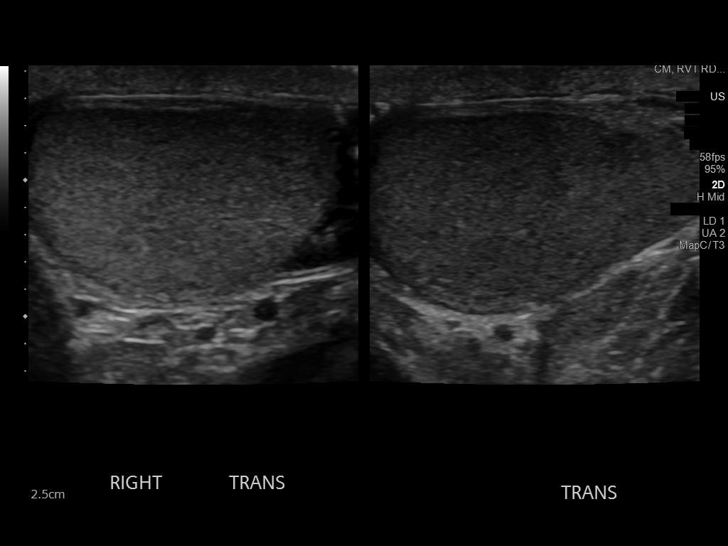
[im 7/81]
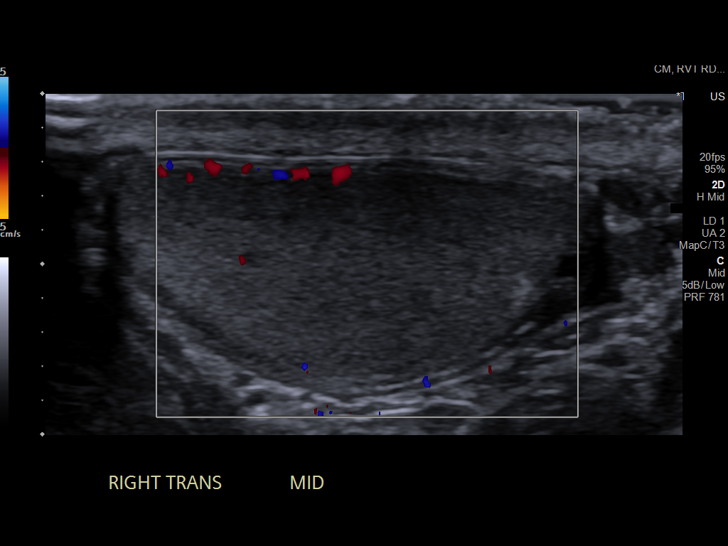
[im 14/81]
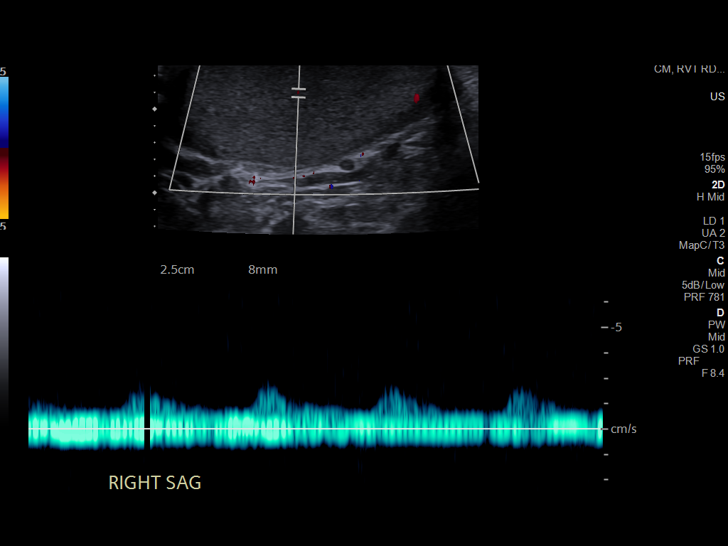
[im 21/81]
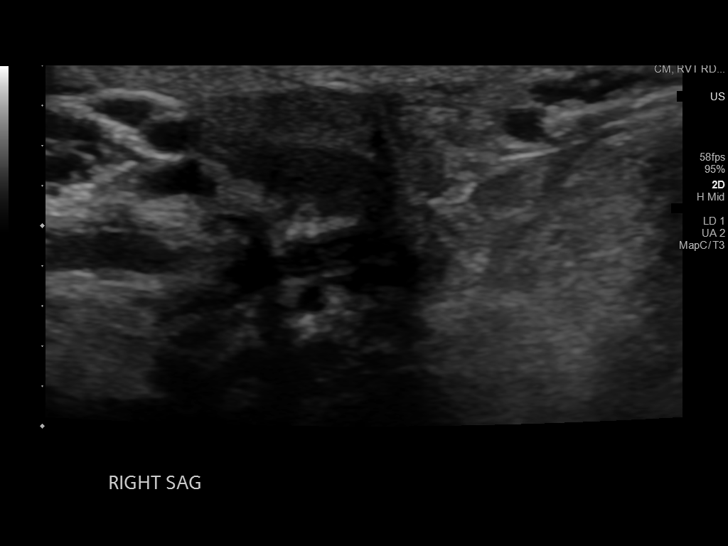
[im 27/81]
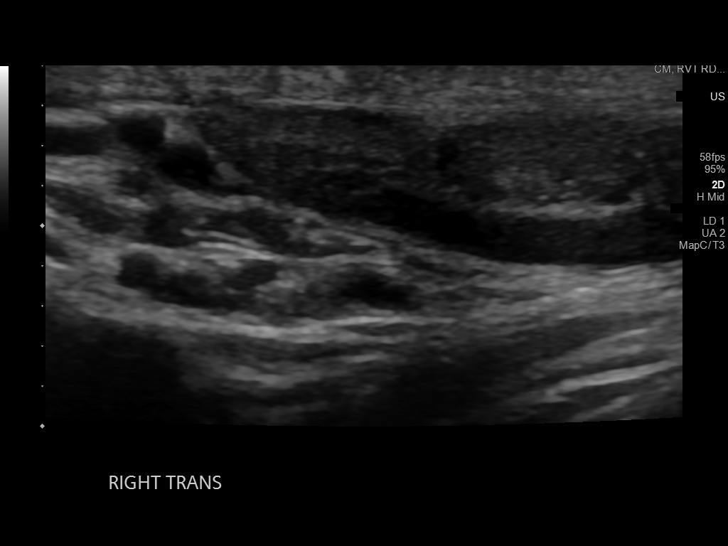
[im 31/81]
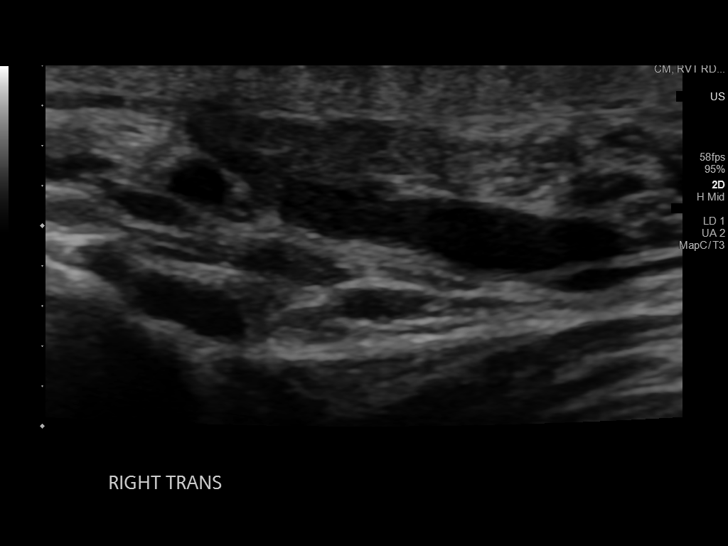
[im 37/81]
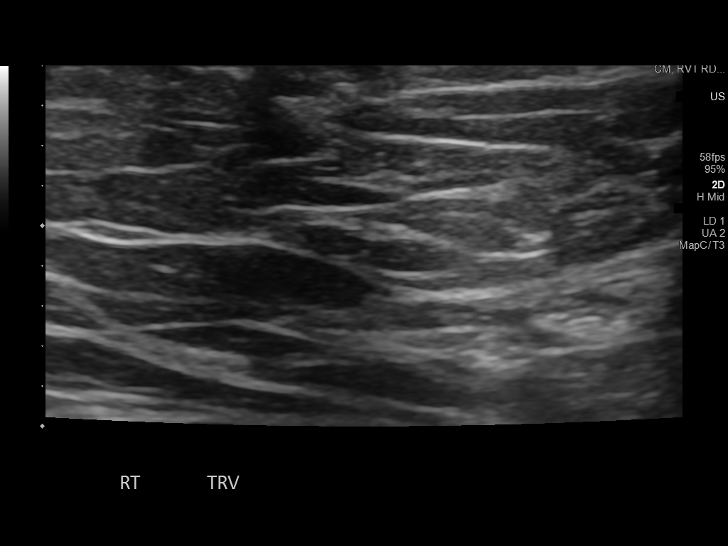
[im 44/81]
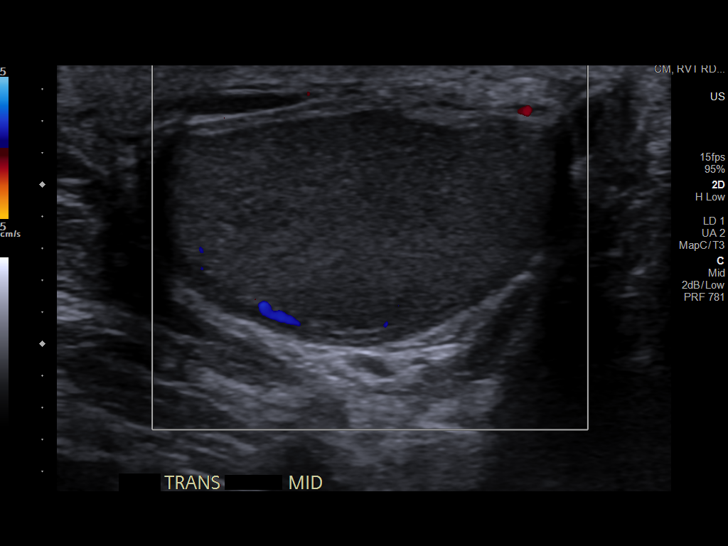
[im 51/81]
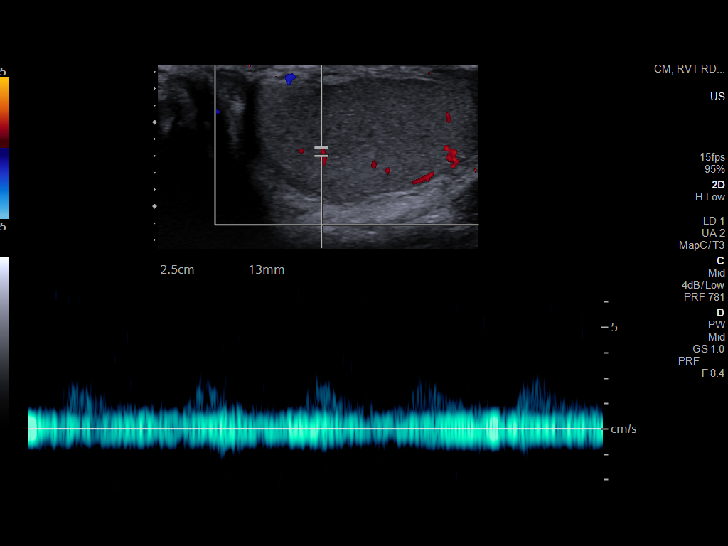
[im 54/81]
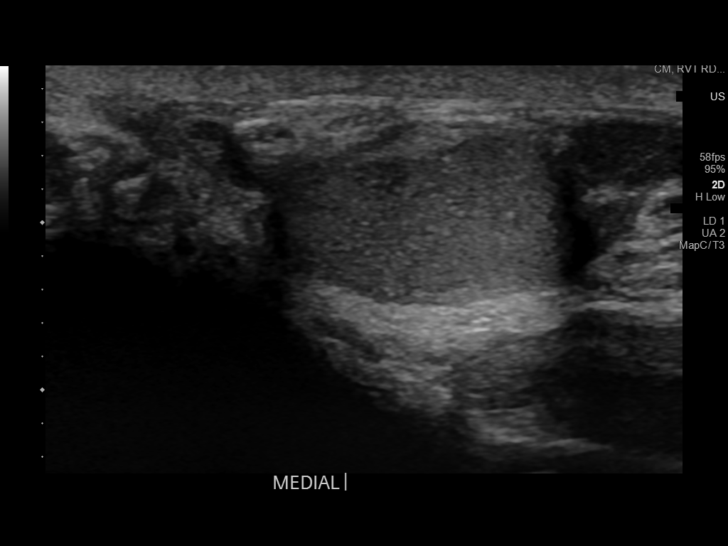
[im 61/81]
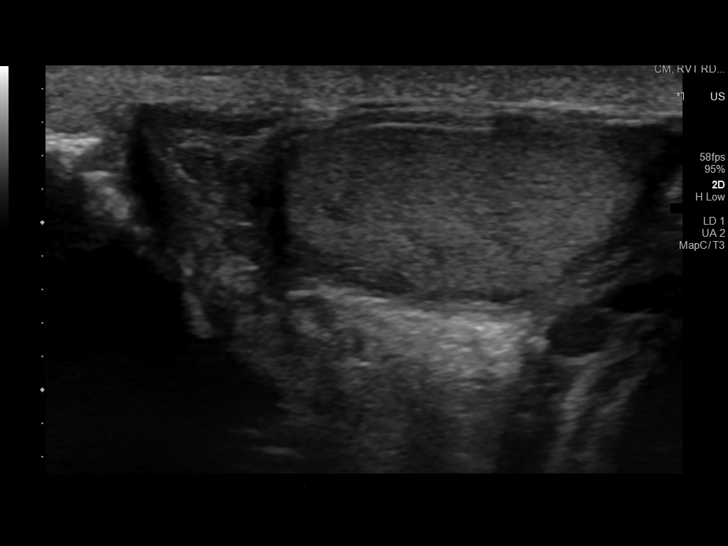
[im 67/81]
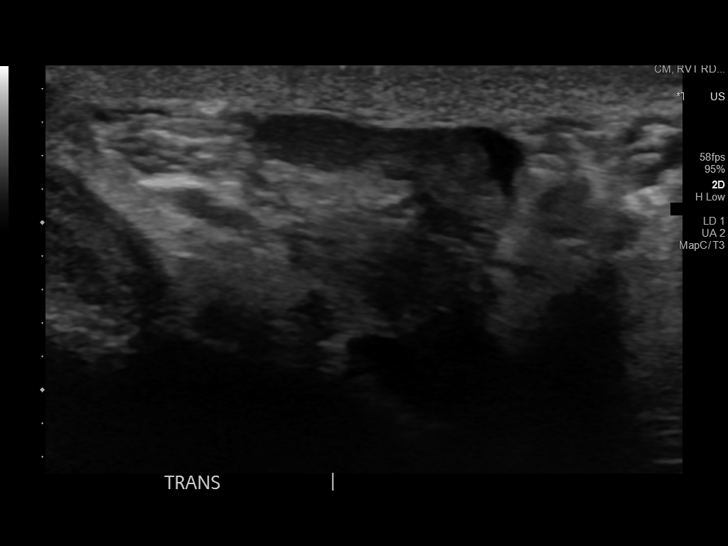
[im 74/81]
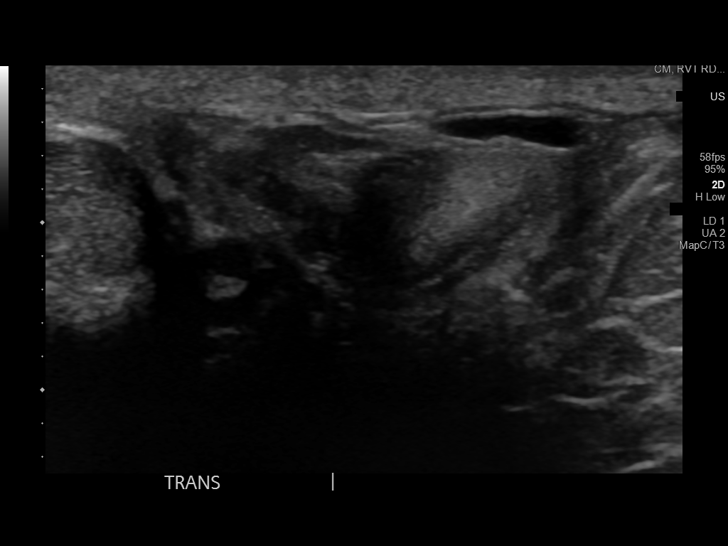
[im 81/81]
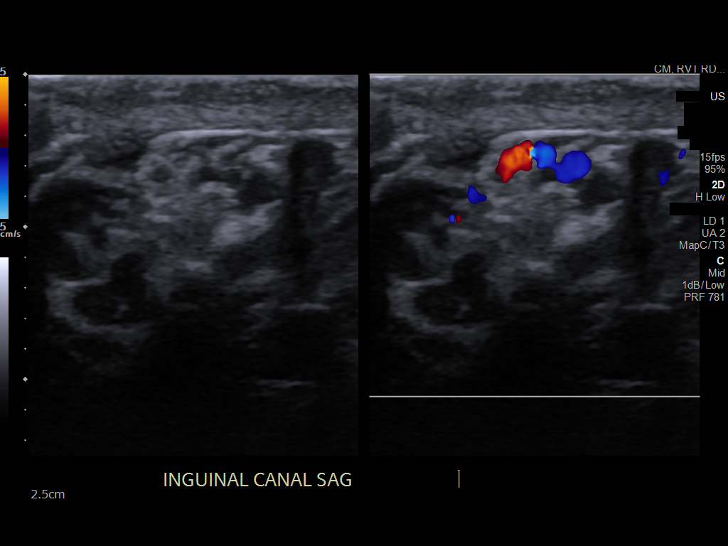

[14 of 25 positions shown; findings below may reference images not displayed]

FINDINGS: Right testicle

Measurements: 3.8 x 1.5 x 2.5 cm. No mass or microlithiasis
visualized. Normal blood flow with color Doppler.

Left testicle

Measurements: 2.6 x 1.6 x 2.4 cm. No mass or microlithiasis
visualized.Normal blood flow with color Doppler.

Right epididymis:  Normal in size and appearance.

Left epididymis:  Normal in size and appearance.

Hydrocele:  None visualized.

Varicocele:  None visualized.

Pulsed Doppler interrogation of both testes demonstrates normal low
resistance arterial and venous waveforms bilaterally.
IMPRESSION: Normal scrotal ultrasound.  No evidence of testicular torsion.
